# Patient Record
Sex: Female | Born: 1975 | Race: White | Hispanic: No | Marital: Single | Smoking: Current every day smoker
Health system: Southern US, Community
[De-identification: ages and names within clinical notes are randomized; demographics above are authoritative.]

---

## 2010-11-02 ENCOUNTER — Emergency Department: Payer: Self-pay | Admitting: Unknown Physician Specialty

## 2012-03-27 ENCOUNTER — Emergency Department: Payer: Self-pay | Admitting: Emergency Medicine

## 2012-03-27 LAB — URINALYSIS, COMPLETE
Leukocyte Esterase: NEGATIVE
Nitrite: POSITIVE
Ph: 6 (ref 4.5–8.0)
Protein: NEGATIVE
Specific Gravity: 1.01 (ref 1.003–1.030)
WBC UR: 15 /HPF (ref 0–5)

## 2012-05-28 ENCOUNTER — Emergency Department: Payer: Self-pay | Admitting: Emergency Medicine

## 2012-05-28 LAB — URINALYSIS, COMPLETE
Glucose,UR: NEGATIVE mg/dL (ref 0–75)
Ph: 5 (ref 4.5–8.0)
Protein: 100
RBC,UR: 28 /HPF (ref 0–5)
Specific Gravity: 1.031 (ref 1.003–1.030)
Squamous Epithelial: 3

## 2012-05-28 LAB — COMPREHENSIVE METABOLIC PANEL
Albumin: 4 g/dL (ref 3.4–5.0)
Alkaline Phosphatase: 56 U/L (ref 50–136)
Chloride: 110 mmol/L — ABNORMAL HIGH (ref 98–107)
Co2: 20 mmol/L — ABNORMAL LOW (ref 21–32)
Creatinine: 0.81 mg/dL (ref 0.60–1.30)
EGFR (Non-African Amer.): >60
Potassium: 3.6 mmol/L (ref 3.5–5.1)
SGPT (ALT): 15 U/L (ref 12–78)

## 2012-05-28 LAB — CBC
HGB: 11.9 g/dL — ABNORMAL LOW (ref 12.0–16.0)
RBC: 3.82 10*6/uL (ref 3.80–5.20)
RDW: 13.2 % (ref 11.5–14.5)

## 2012-05-30 LAB — URINE CULTURE

## 2013-05-22 ENCOUNTER — Emergency Department: Payer: Self-pay | Admitting: Emergency Medicine

## 2013-05-22 LAB — URINALYSIS, COMPLETE
Bacteria: NONE SEEN
Bilirubin,UR: NEGATIVE
Glucose,UR: NEGATIVE mg/dL (ref 0–75)
Leukocyte Esterase: NEGATIVE
Ph: 8 (ref 4.5–8.0)
Specific Gravity: 1.01 (ref 1.003–1.030)

## 2013-05-22 LAB — GC/CHLAMYDIA PROBE AMP

## 2013-05-22 LAB — WET PREP, GENITAL

## 2013-06-14 IMAGING — CT CT HEAD WITHOUT CONTRAST
2 series · 16 of 30 positions shown, 20 images · non-contrast
Comparison: none

REASON FOR EXAM: headache, syncope
COMMENTS:

PROCEDURE:     CT  - CT HEAD WITHOUT CONTRAST  - May 28, 2012  [DATE]
RESULT:     Comparison:  None
TECHNIQUE: Multiple axial images from the foramen magnum to the vertex were
obtained without IV contrast.

[Series 2: without · axial · non-contrast · 0.40mm/px · z∈[+358,+484]mm · 13 of 31 slices shown, 17 images]
[im 3/31  brain]
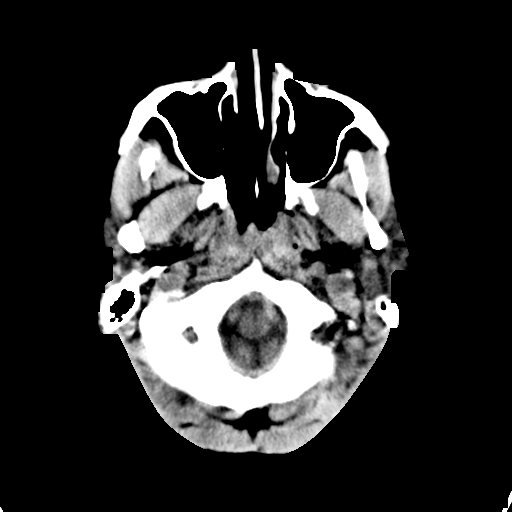
[im 3/31  bone]
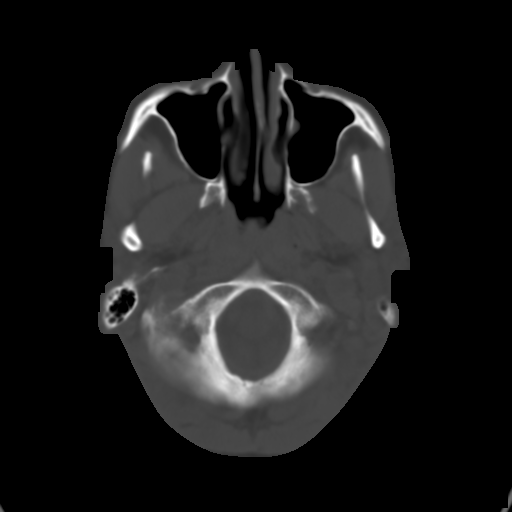
[im 5/31  brain]
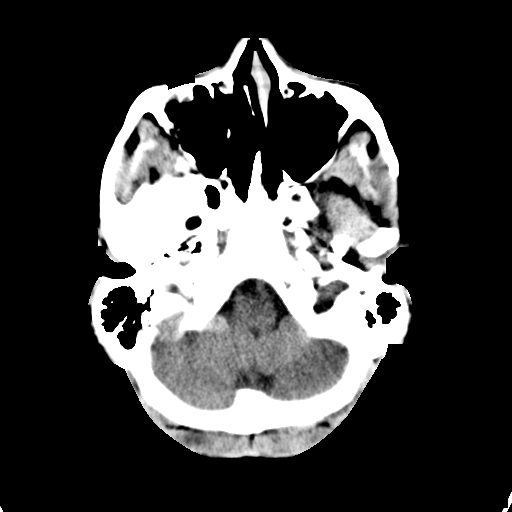
[im 7/31  brain]
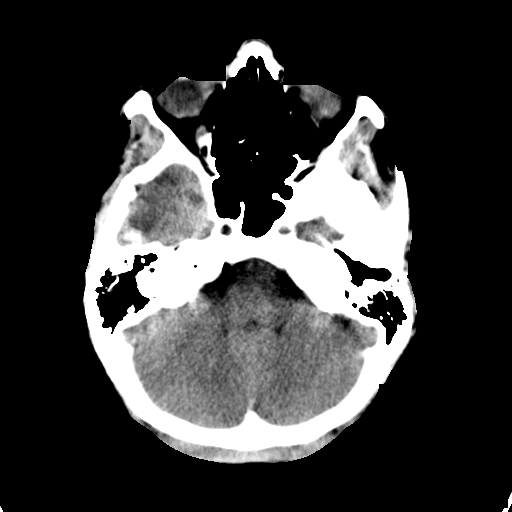
[im 9/31  brain]
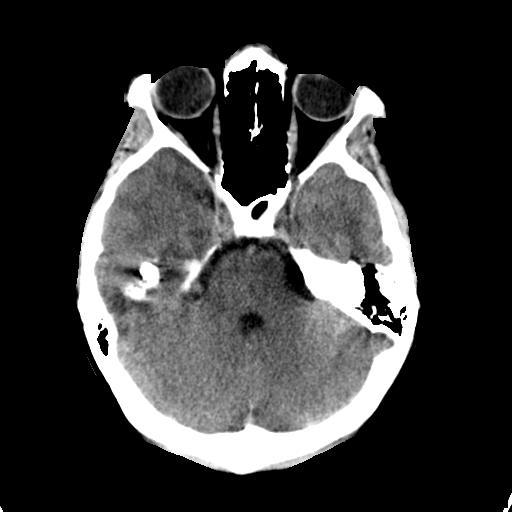
[im 11/31  brain]
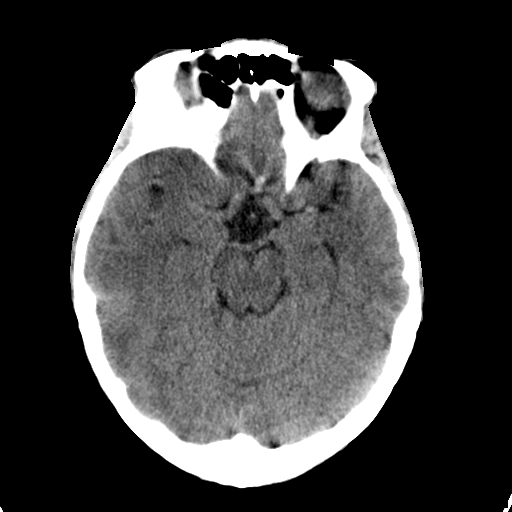
[im 11/31  bone]
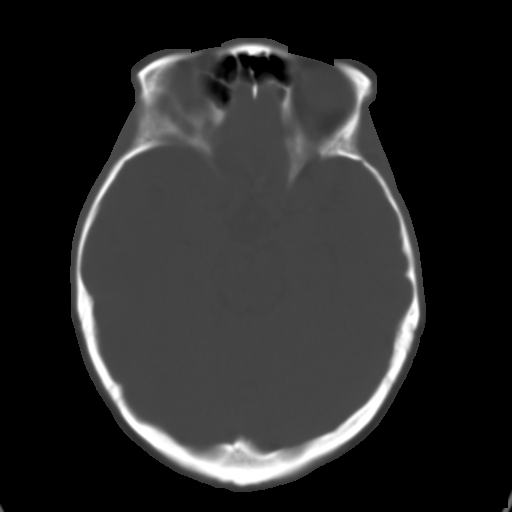
[im 13/31  brain]
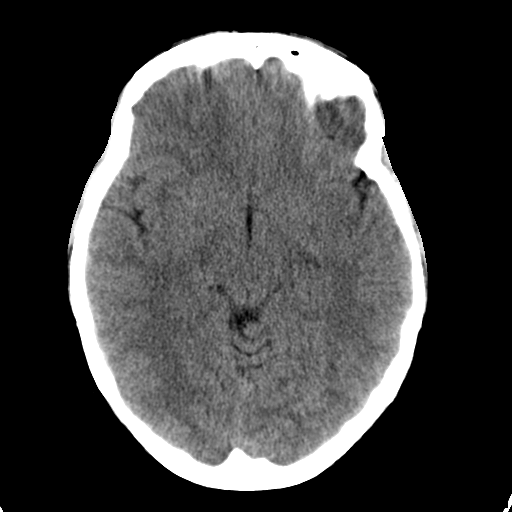
[im 16/31  brain]
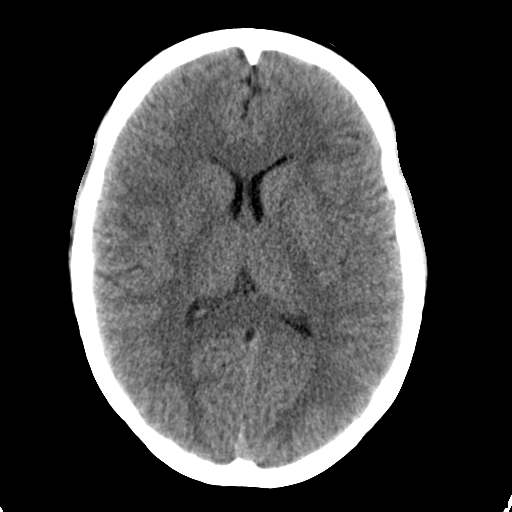
[im 18/31  brain]
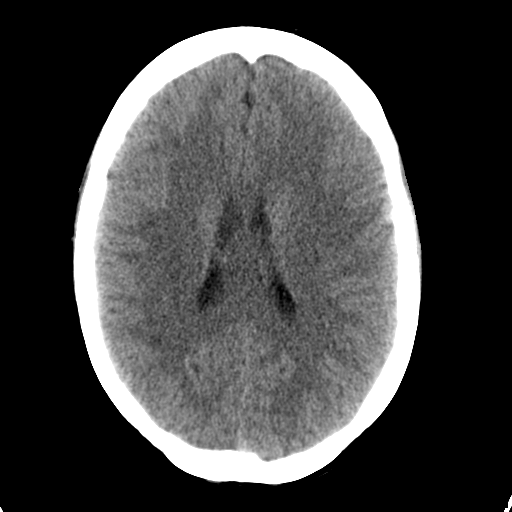
[im 20/31  brain]
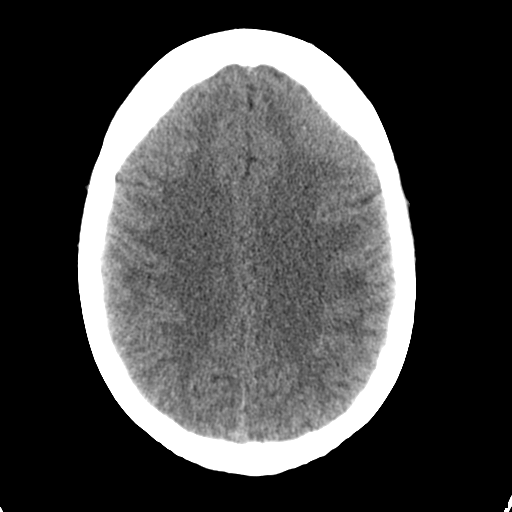
[im 20/31  bone]
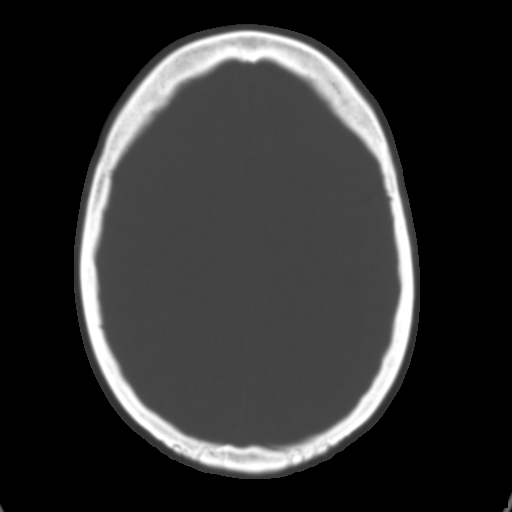
[im 22/31  brain]
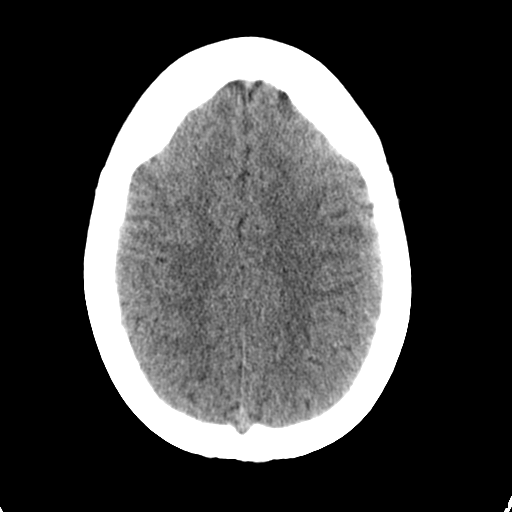
[im 24/31  brain]
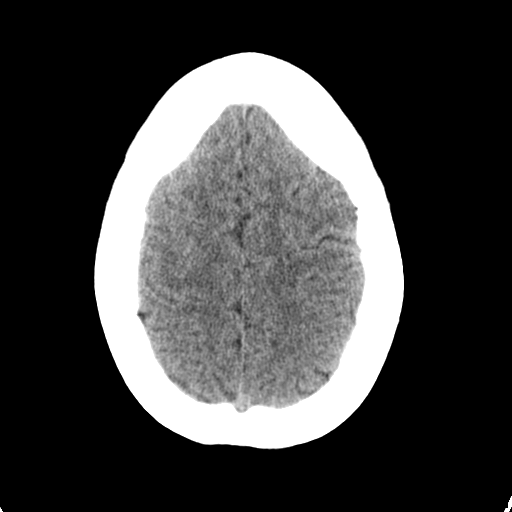
[im 26/31  brain]
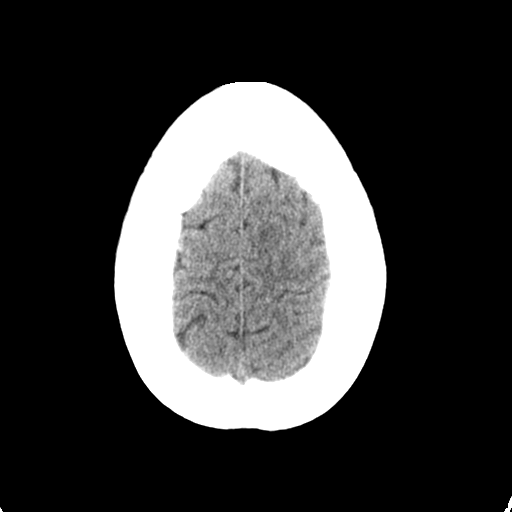
[im 28/31  brain]
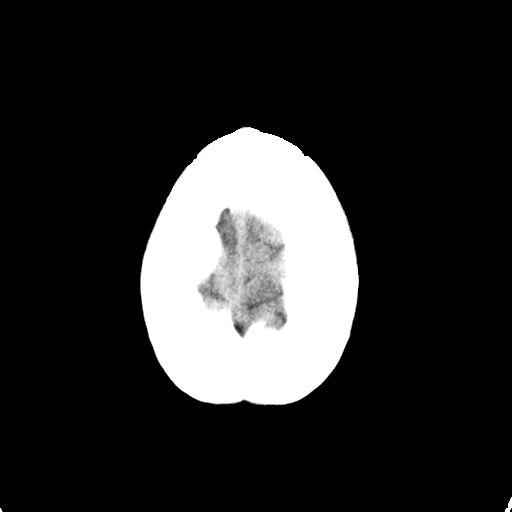
[im 28/31  bone]
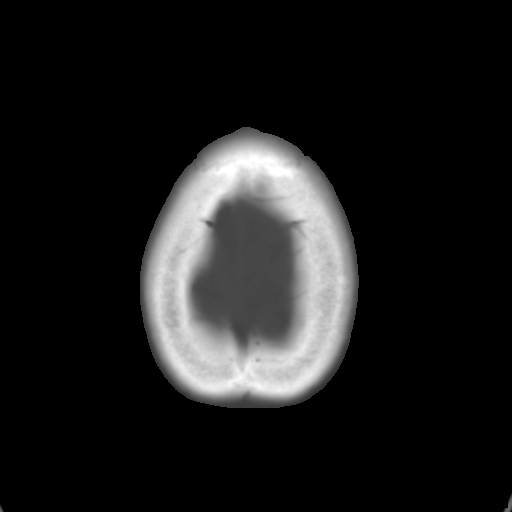

[Series 3: bone · axial · 0.40mm/px · z∈[+358,+398]mm · 3 of 31 slices shown]
[im 3/31  bone]
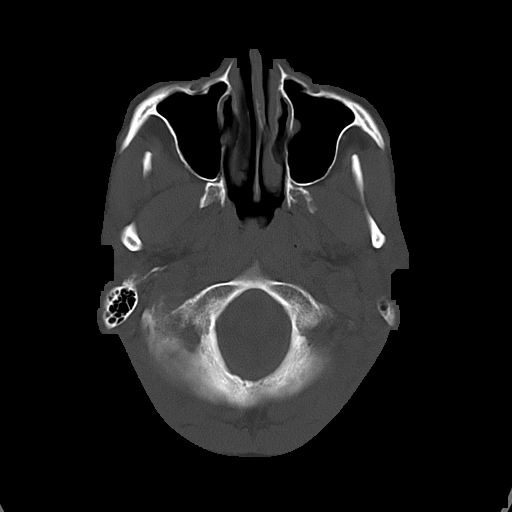
[im 7/31  bone]
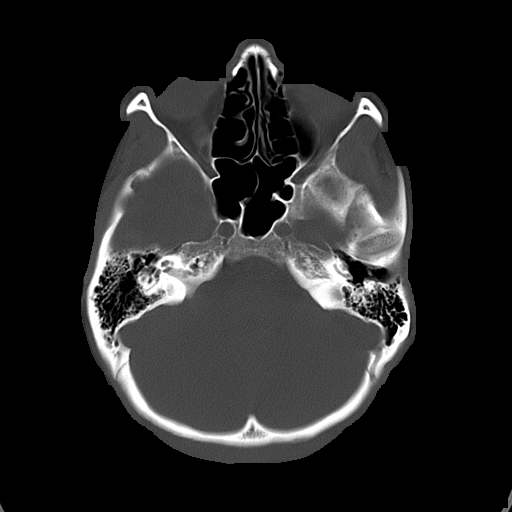
[im 11/31  bone]
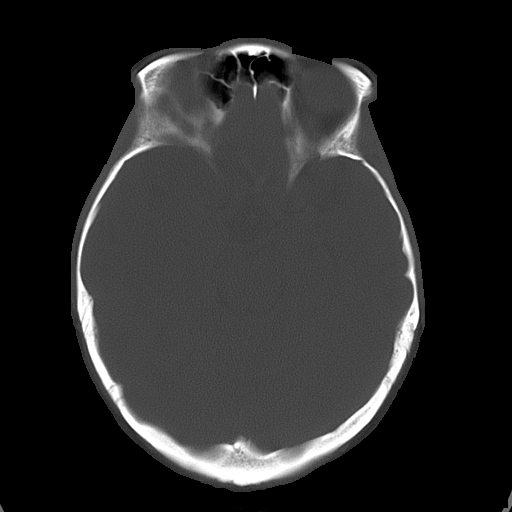

[16 of 30 positions shown; findings below may reference images not displayed]

FINDINGS: There is no evidence for mass effect, midline shift, or extra-axial fluid
collections. There is no evidence for space-occupying lesion, intracranial
hemorrhage, or cortical-based area of infarction. There are a few tiny
polypoid mucus retention cysts in the left maxillary sinus.

The osseous structures are unremarkable.
IMPRESSION: No acute intracranial process.

[REDACTED]

## 2017-07-10 ENCOUNTER — Encounter: Payer: Self-pay | Admitting: Emergency Medicine

## 2017-07-10 ENCOUNTER — Emergency Department
Admission: EM | Admit: 2017-07-10 | Discharge: 2017-07-10 | Disposition: A | Discharge status: Home / Self Care | Payer: Self-pay | Attending: Emergency Medicine | Admitting: Emergency Medicine

## 2017-07-10 ENCOUNTER — Other Ambulatory Visit: Payer: Self-pay

## 2017-07-10 DIAGNOSIS — Y999 Unspecified external cause status: Secondary | ICD-10-CM | POA: Insufficient documentation

## 2017-07-10 DIAGNOSIS — X58XXXA Exposure to other specified factors, initial encounter: Secondary | ICD-10-CM | POA: Insufficient documentation

## 2017-07-10 DIAGNOSIS — F17210 Nicotine dependence, cigarettes, uncomplicated: Secondary | ICD-10-CM | POA: Insufficient documentation

## 2017-07-10 DIAGNOSIS — S0502XA Injury of conjunctiva and corneal abrasion without foreign body, left eye, initial encounter: Secondary | ICD-10-CM | POA: Insufficient documentation

## 2017-07-10 DIAGNOSIS — Y939 Activity, unspecified: Secondary | ICD-10-CM | POA: Insufficient documentation

## 2017-07-10 DIAGNOSIS — Y929 Unspecified place or not applicable: Secondary | ICD-10-CM | POA: Insufficient documentation

## 2017-07-10 MED ORDER — NAPHAZOLINE-PHENIRAMINE 0.025-0.3 % OP SOLN: 1.0000 [drp] | Freq: Four times a day (QID) | OPHTHALMIC | 0 refills | Status: DC | PRN

## 2017-07-10 MED ORDER — TETRACAINE HCL 0.5 % OP SOLN
OPHTHALMIC | Status: AC
  Administered 2017-07-10: 1 [drp] via OPHTHALMIC
  Filled 2017-07-10: qty 4

## 2017-07-10 MED ORDER — EYE WASH OPHTH SOLN: 1.0000 [drp] | OPHTHALMIC | Status: DC | PRN

## 2017-07-10 MED ORDER — GENTAMICIN SULFATE 0.3 % OP SOLN: 1.0000 [drp] | OPHTHALMIC | 0 refills | Status: DC

## 2017-07-10 MED ORDER — TETRACAINE HCL 0.5 % OP SOLN
1.0000 [drp] | Freq: Once | OPHTHALMIC | Status: AC
  Administered 2017-07-10: 1 [drp] via OPHTHALMIC

## 2017-07-10 MED ORDER — FLUORESCEIN SODIUM 1 MG OP STRP
ORAL_STRIP | OPHTHALMIC | Status: AC
  Filled 2017-07-10: qty 1

## 2017-07-10 MED ORDER — EYE WASH OPHTH SOLN
OPHTHALMIC | Status: AC
  Filled 2017-07-10: qty 118

## 2017-07-10 NOTE — Discharge Instructions (Signed)
Use eyedrops as directed follow-up with eye clinic if no improvement in 3 days.

## 2017-07-10 NOTE — ED Provider Notes (Signed)
[facility] Emergency Department Provider Note   ____________________________________________   First MD Initiated Contact with Patient 07/10/17 1148     (approximate)  I have reviewed the triage vital signs and the nursing notes.   HISTORY  Chief Complaint Eye Problem    HPI Jacqueline Knight is a 42 y.o. female patient pain and watery left eye for approximately 2 months.  Patient states symptoms wax and wane but worsened in the past week.  Patient denies purulent drainage from the eye.  Patient states slight photophobic.  Patient has a history of contact usage but has not used to contact since the pain started.  No other pulses measured for complaint.  Patient denies vision loss.   History reviewed. No pertinent past medical history.  There are no active problems to display for this patient.     Prior to Admission medications   Medication Sig Start Date End Date Taking? Authorizing Provider  gentamicin (GARAMYCIN) 0.3 % ophthalmic solution Place 1 drop into the left eye every 4 (four) hours. 07/10/17   Joni ReiningSmith, Paxson Harrower K, PA-C  naphazoline-pheniramine (NAPHCON-A) 0.025-0.3 % ophthalmic solution Place 1 drop into the left eye 4 (four) times daily as needed for eye irritation. 07/10/17   Joni ReiningSmith, Petar Mucci K, PA-C    Allergies Patient has no known allergies.  No family history on file.  Social History Social History   Tobacco Use  . Smoking status: Current Every Day Smoker    Packs/day: 0.50    Types: Cigarettes  Substance Use Topics  . Alcohol use: No    Frequency: Never  . Drug use: No    Review of Systems Constitutional: No fever/chills Eyes: Mild photophobia and watery drainage from the eye. ENT: No sore throat. Cardiovascular: Denies chest pain. Respiratory: Denies shortness of breath. Gastrointestinal: No abdominal pain.  No nausea, no vomiting.  No diarrhea.  No constipation. Genitourinary: Negative for dysuria. Musculoskeletal: Negative  for back pain. Skin: Negative for rash. Neurological: Negative for headaches, focal weakness or numbness.   ____________________________________________   PHYSICAL EXAM:  VITAL SIGNS: ED Triage Vitals  Enc Vitals Group     BP 07/10/17 1142 135/86     Pulse Rate 07/10/17 1142 82     Resp 07/10/17 1142 18     Temp 07/10/17 1142 97.6 F (36.4 C)     Temp Source 07/10/17 1142 Oral     SpO2 07/10/17 1142 99 %     Weight 07/10/17 1143 175 lb (79.4 kg)     Height 07/10/17 1143 5' 4.5" (1.638 m)     Head Circumference --      Peak Flow --      Pain Score 07/10/17 1147 6     Pain Loc --      Pain Edu? --      Excl. in GC? --    Constitutional: Alert and oriented. Well appearing and in no acute distress. Eyes:  PERRL. EOMI. visual acuity patient at 2025 right eye and 2030 left affected eye with corrective lenses.  Fluorescein stain reveal left corneal abrasion. Cardiovascular: Normal rate, regular rhythm. Grossly normal heart sounds.  Good peripheral circulation. Respiratory: Normal respiratory effort.  No retractions. Lungs CTAB. Skin:  Skin is warm, dry and intact. No rash noted. Psychiatric: Mood and affect are normal. Speech and behavior are normal.  ____________________________________________   LABS (all labs ordered are listed, but only abnormal results are displayed)  Labs Reviewed - No data to display ____________________________________________  EKG   ____________________________________________  RADIOLOGY  No results found.  ____________________________________________   PROCEDURES  Procedure(s) performed: None  Procedures  Critical Care performed: No  ____________________________________________   INITIAL IMPRESSION / ASSESSMENT AND PLAN / ED COURSE  As part of my medical decision making, I reviewed the following data within the electronic MEDICAL RECORD NUMBER    Left eye pain secondary to corneal abrasion.  Patient given discharge care  instruction.  Patient given prescription for gentamicin and Naphcon-A.  Patient advised to follow-up with the [facility] if no improvement in 3 days secondary to the length of the time with corneal abrasion      ____________________________________________   FINAL CLINICAL IMPRESSION(S) / ED DIAGNOSES  Final diagnoses:  Abrasion of left cornea, initial encounter     ED Discharge Orders        Ordered    gentamicin (GARAMYCIN) 0.3 % ophthalmic solution  Every 4 hours     07/10/17 1203    naphazoline-pheniramine (NAPHCON-A) 0.025-0.3 % ophthalmic solution  4 times daily PRN     07/10/17 1203       Note:  This document was prepared using Dragon voice recognition software and may include unintentional dictation errors.    Joni Reining, PA-C 07/10/17 1209    Dionne Bucy, MD 07/10/17 1556

## 2017-07-10 NOTE — ED Triage Notes (Signed)
Pt reports that she has had a watery eye for 2 months but the last week has been bothering her more. Its watery but never crusted shut. States that the light makes it worse.

## 2017-09-26 ENCOUNTER — Encounter: Payer: Self-pay | Admitting: Emergency Medicine

## 2017-09-26 ENCOUNTER — Other Ambulatory Visit: Payer: Self-pay

## 2017-09-26 ENCOUNTER — Emergency Department
Admission: EM | Admit: 2017-09-26 | Discharge: 2017-09-26 | Disposition: A | Discharge status: Home / Self Care | Payer: Self-pay | Attending: Emergency Medicine | Admitting: Emergency Medicine

## 2017-09-26 DIAGNOSIS — F17210 Nicotine dependence, cigarettes, uncomplicated: Secondary | ICD-10-CM | POA: Insufficient documentation

## 2017-09-26 DIAGNOSIS — R220 Localized swelling, mass and lump, head: Secondary | ICD-10-CM | POA: Insufficient documentation

## 2017-09-26 DIAGNOSIS — K047 Periapical abscess without sinus: Secondary | ICD-10-CM | POA: Insufficient documentation

## 2017-09-26 MED ORDER — OXYCODONE-ACETAMINOPHEN 7.5-325 MG PO TABS: 1.0000 | ORAL_TABLET | Freq: Four times a day (QID) | ORAL | 0 refills | Status: DC | PRN

## 2017-09-26 MED ORDER — OXYCODONE-ACETAMINOPHEN 5-325 MG PO TABS
1.0000 | ORAL_TABLET | Freq: Once | ORAL | Status: AC
  Administered 2017-09-26: 1 via ORAL
  Filled 2017-09-26: qty 1

## 2017-09-26 MED ORDER — AMOXICILLIN 500 MG PO CAPS: 500.0000 mg | ORAL_CAPSULE | Freq: Three times a day (TID) | ORAL | 0 refills | Status: DC

## 2017-09-26 MED ORDER — LIDOCAINE VISCOUS 2 % MT SOLN: 5.0000 mL | Freq: Four times a day (QID) | OROMUCOSAL | 0 refills | Status: DC | PRN

## 2017-09-26 MED ORDER — LIDOCAINE VISCOUS 2 % MT SOLN
15.0000 mL | Freq: Once | OROMUCOSAL | Status: AC
  Administered 2017-09-26: 15 mL via OROMUCOSAL
  Filled 2017-09-26: qty 15

## 2017-09-26 MED ORDER — IBUPROFEN 600 MG PO TABS: 600.0000 mg | ORAL_TABLET | Freq: Three times a day (TID) | ORAL | 0 refills | Status: DC | PRN

## 2017-09-26 MED ORDER — IBUPROFEN 800 MG PO TABS
800.0000 mg | ORAL_TABLET | Freq: Once | ORAL | Status: AC
  Administered 2017-09-26: 800 mg via ORAL
  Filled 2017-09-26: qty 1

## 2017-09-26 NOTE — ED Provider Notes (Signed)
[facility] Emergency Department Provider Note   ____________________________________________   First MD Initiated Contact with Patient 09/26/17 1115     (approximate)  I have reviewed the triage vital signs and the nursing notes.   HISTORY  Chief Complaint Dental Pain    HPI Jacqueline Knight is a 42 y.o. female patient complain of left lower jaw pain and swelling secondary to dental issues.  Patient state increasing dental pain and swelling for 5 days.  Patient rates the pain as a 9/10.  Patient described the pain is "achy/shooting".  No relief over-the-counter anti-inflammatory medications.  History reviewed. No pertinent past medical history.  There are no active problems to display for this patient.   History reviewed. No pertinent surgical history.  Prior to Admission medications   Medication Sig Start Date End Date Taking? Authorizing Provider  amoxicillin (AMOXIL) 500 MG capsule Take 1 capsule (500 mg total) by mouth 3 (three) times daily. 09/26/17   Joni Reining, PA-C  gentamicin (GARAMYCIN) 0.3 % ophthalmic solution Place 1 drop into the left eye every 4 (four) hours. 07/10/17   Joni Reining, PA-C  ibuprofen (ADVIL,MOTRIN) 600 MG tablet Take 1 tablet (600 mg total) by mouth every 8 (eight) hours as needed. 09/26/17   Joni Reining, PA-C  lidocaine (XYLOCAINE) 2 % solution Use as directed 5 mLs in the mouth or throat every 6 (six) hours as needed for mouth pain. For oral swish as needed dental pain 09/26/17   Joni Reining, PA-C  naphazoline-pheniramine (NAPHCON-A) 0.025-0.3 % ophthalmic solution Place 1 drop into the left eye 4 (four) times daily as needed for eye irritation. 07/10/17   Joni Reining, PA-C  oxyCODONE-acetaminophen (PERCOCET) 7.5-325 MG tablet Take 1 tablet by mouth every 6 (six) hours as needed for severe pain. 09/26/17   Joni Reining, PA-C    Allergies Patient has no known allergies.  No family history on file.  Social  History Social History   Tobacco Use  . Smoking status: Current Every Day Smoker    Packs/day: 0.50    Types: Cigarettes  Substance Use Topics  . Alcohol use: No    Frequency: Never  . Drug use: No    Review of Systems Constitutional: No fever/chills Eyes: No visual changes. ENT: Dental pain. Cardiovascular: Denies chest pain. Respiratory: Denies shortness of breath. Gastrointestinal: No abdominal pain.  No nausea, no vomiting.  No diarrhea.  No constipation. Genitourinary: Negative for dysuria. Musculoskeletal: Negative for back pain. Skin: Negative for rash. Neurological: Negative for headaches, focal weakness or numbness.   ____________________________________________   PHYSICAL EXAM:  VITAL SIGNS: ED Triage Vitals  Enc Vitals Group     BP 09/26/17 1037 (!) 145/82     Pulse Rate 09/26/17 1037 90     Resp 09/26/17 1037 20     Temp 09/26/17 1037 98.2 F (36.8 C)     Temp Source 09/26/17 1037 Oral     SpO2 09/26/17 1037 100 %     Weight 09/26/17 1038 170 lb (77.1 kg)     Height 09/26/17 1038 5' 4.5" (1.638 m)     Head Circumference --      Peak Flow --      Pain Score 09/26/17 1038 9     Pain Loc --      Pain Edu? --      Excl. in GC? --    Constitutional: Alert and oriented. Well appearing and in no acute distress.  Mouth/Throat: Mucous membranes are moist.  Oropharynx non-erythematous.  Edematous gingiva and devitalized tooth #5 and 6. Neck: No stridor.  Hematological/Lymphatic/Immunilogical: No cervical lymphadenopathy. Cardiovascular: Normal rate, regular rhythm. Grossly normal heart sounds.  Good peripheral circulation. Respiratory: Normal respiratory effort.  No retractions. Lungs CTAB. Skin:  Skin is warm, dry and intact. No rash noted. Psychiatric: Mood and affect are normal. Speech and behavior are normal.  ____________________________________________   LABS (all labs ordered are listed, but only abnormal results are displayed)  Labs Reviewed -  No data to display ____________________________________________  EKG   ____________________________________________  RADIOLOGY  ED MD interpretation:    Official radiology report(s): No results found.  ____________________________________________   PROCEDURES  Procedure(s) performed: None  Procedures  Critical Care performed: No  ____________________________________________   INITIAL IMPRESSION / ASSESSMENT AND PLAN / ED COURSE  As part of my medical decision making, I reviewed the following data within the electronic MEDICAL RECORD NUMBER    Dental pain secondary to abscess.  Patient given discharge care instruction.  Patient given list of dental clinics for follow-up care.  Patient advised take medication as directed.      ____________________________________________   FINAL CLINICAL IMPRESSION(S) / ED DIAGNOSES  Final diagnoses:  Dental abscess     ED Discharge Orders        Ordered    amoxicillin (AMOXIL) 500 MG capsule  3 times daily     09/26/17 1132    oxyCODONE-acetaminophen (PERCOCET) 7.5-325 MG tablet  Every 6 hours PRN     09/26/17 1132    ibuprofen (ADVIL,MOTRIN) 600 MG tablet  Every 8 hours PRN     09/26/17 1132    lidocaine (XYLOCAINE) 2 % solution  Every 6 hours PRN     09/26/17 1132       Note:  This document was prepared using Dragon voice recognition software and may include unintentional dictation errors.    Joni ReiningSmith, Ronald K, PA-C 09/26/17 1138    Don PerkingVeronese, [place], MD 09/26/17 [phone]

## 2017-09-26 NOTE — Discharge Instructions (Signed)
Advised to follow-up from list of dental clinics provided. OPTIONS FOR DENTAL FOLLOW UP CARE  [place] Department of Health and Human Services - Local Safety Net Dental Clinics [email or web address].gov/dph/oralhealth/services/safetynetclinics.htm   [facility] [phone])  [facility] [phone])  [facility] [phone]  [facility] [phone])  [facility] [phone]) This clinic caters to the indigent population and is on a lottery system. Location: [employer] [facility] [address] [facility], [address], [place] [facility] Hours: Wednesdays from 6pm - 9pm, patients seen by a lottery system. For dates, call or go to [email or web address] Services: Cleanings, fillings and simple extractions. Payment Options: DENTAL WORK IS FREE OF CHARGE. Bring proof of income or support. Best way to get seen: Arrive at 5:15 pm - this is a lottery, NOT first come/first serve, so arriving earlier will not increase your chances of being seen.     [facility] [phone] Select option 1 for emergencies   Location: [facility], [address], [place] [facility] Hours: No walk-ins accepted - call the day before to schedule an appointment. Check in times are 9:30 am and 1:30 pm. Services: Simple extractions, temporary fillings, pulpectomy/pulp debridement, uncomplicated abscess drainage. Payment Options: PAYMENT IS DUE AT THE TIME OF SERVICE.  Fee is usually $100-200, additional surgical procedures (e.g. abscess drainage) may be extra. Cash, checks, Visa/MasterCard accepted.  Can file Medicaid if patient is covered for dental - patient should call case worker to check. No discount for [facility] patients. Best way to get seen: MUST call the day before and get onto the schedule. Can usually be seen the next 1-2 days. No walk-ins accepted.      [facility] [phone]   Location: [facility], [address], [place] Clinic Hours: M, W, Th, F 8am or 1:30pm, Tues 9a or 1:30 - first come/first served. Services: Simple extractions, temporary fillings, uncomplicated abscess drainage.  You do not need to be [place] resident. Payment Options: PAYMENT IS DUE AT THE TIME OF SERVICE. Dental insurance, otherwise sliding scale - bring proof of income or support. Depending on income and treatment needed, cost is usually $50-200. Best way to get seen: Arrive early as it is first come/first served.     [facility] [phone]   Location: [address] Clinic Hours: Mon-Thu 8a-5p Services: Most basic dental services including extractions and fillings. Payment Options: PAYMENT IS DUE AT THE TIME OF SERVICE. Sliding scale, up to 50% off - bring proof if income or support. Medicaid with dental option accepted. Best way to get seen: Call to schedule an appointment, can usually be seen within 2 weeks OR they will try to see walk-ins - show up at 8a or 2p (you may have to wait).     [facility] [phone] [place] RESIDENTS ONLY   Location: [facility], [address], [place], [place] [address] Clinic Hours: By appointment only. Monday - Thursday 8am-5pm, Friday 8am-12pm Services: Cleanings, fillings, extractions. Payment Options: PAYMENT IS DUE AT THE TIME OF SERVICE. Cash, Visa or MasterCard. Sliding scale - $30 minimum per service. Best way to get seen: Come in to office, complete packet and make an appointment - need proof of income or support monies for each household member and proof of [place] residence. Usually takes about a month to get in.     [facility] [phone]   Location: [address], [place] [facility] Hours: Walk-in Urgent Care  Dental Services are offered Monday-Friday mornings only. The numbers of emergencies accepted daily is limited to the number of providers available. Maximum 15 - Mondays, Wednesdays & Thursdays Maximum 10 - Tuesdays & Fridays Services: You do not need to be a [facility] resident to be seen for a dental emergency. Emergencies are defined as pain, swelling, abnormal bleeding, or dental trauma. Walkins will receive x-rays if needed. NOTE: Dental cleaning is not an emergency. Payment Options: PAYMENT IS DUE AT THE TIME OF SERVICE. Minimum co-pay is $40.00 for uninsured patients. Minimum co-pay is $3.00 for Medicaid with dental coverage. Dental Insurance is accepted and must be presented at time of visit. Medicare does not cover dental. Forms of payment: Cash, credit card, checks. Best way to get seen: If not previously registered with the clinic, walk-in dental registration begins at 7:15 am and is on a first come/first serve basis. If previously registered with the clinic, call to make an appointment.     [facility] [place] ONLY   Location: [address], [place], [place] Clinic Hours: Mon-Thu 10a-2p Services: Extractions only! Payment Options: FREE (donations accepted) - bring proof of income or support Best way to get seen: Call and schedule an appointment OR come at 8am on the 1st Monday of every month (except for holidays) when it is first come/first served.     [facility] [phone]   Location: [place], [place] Clinic Hours: Friday mornings Services, Payment Options, Best way to get seen: Call for info

## 2017-09-26 NOTE — ED Triage Notes (Signed)
L lower dental pain x 5 days noted swelling yesterday.

## 2017-11-25 ENCOUNTER — Encounter: Payer: Self-pay | Admitting: Emergency Medicine

## 2017-11-25 ENCOUNTER — Other Ambulatory Visit: Payer: Self-pay

## 2017-11-25 ENCOUNTER — Emergency Department
Admission: EM | Admit: 2017-11-25 | Discharge: 2017-11-25 | Disposition: A | Discharge status: Home / Self Care | Payer: Self-pay | Attending: Emergency Medicine | Admitting: Emergency Medicine

## 2017-11-25 DIAGNOSIS — K029 Dental caries, unspecified: Secondary | ICD-10-CM | POA: Insufficient documentation

## 2017-11-25 DIAGNOSIS — K047 Periapical abscess without sinus: Secondary | ICD-10-CM | POA: Insufficient documentation

## 2017-11-25 DIAGNOSIS — F17210 Nicotine dependence, cigarettes, uncomplicated: Secondary | ICD-10-CM | POA: Insufficient documentation

## 2017-11-25 MED ORDER — AMOXICILLIN 500 MG PO CAPS
500.0000 mg | ORAL_CAPSULE | Freq: Three times a day (TID) | ORAL | 0 refills | Status: DC
Start: 2017-11-25 — End: 2017-12-22

## 2017-11-25 MED ORDER — TRAMADOL HCL 50 MG PO TABS
50.0000 mg | ORAL_TABLET | Freq: Once | ORAL | Status: AC
  Administered 2017-11-25: 50 mg via ORAL
  Filled 2017-11-25: qty 1

## 2017-11-25 MED ORDER — ONDANSETRON 4 MG PO TBDP
4.0000 mg | ORAL_TABLET | Freq: Once | ORAL | Status: AC
  Administered 2017-11-25: 4 mg via ORAL
  Filled 2017-11-25: qty 1

## 2017-11-25 MED ORDER — AMOXICILLIN 500 MG PO CAPS
500.0000 mg | ORAL_CAPSULE | Freq: Once | ORAL | Status: AC
  Administered 2017-11-25: 500 mg via ORAL
  Filled 2017-11-25: qty 1

## 2017-11-25 MED ORDER — TRAMADOL HCL 50 MG PO TABS: 50.0000 mg | ORAL_TABLET | Freq: Two times a day (BID) | ORAL | 0 refills | Status: AC

## 2017-11-25 MED ORDER — ONDANSETRON 4 MG PO TBDP: 4.0000 mg | ORAL_TABLET | Freq: Three times a day (TID) | ORAL | 0 refills | Status: AC | PRN

## 2017-11-25 NOTE — ED Triage Notes (Signed)
Pt comes into the ED via POv c/o Left lower dental pain.  Selling noted but patient in NAD at this time.

## 2017-11-25 NOTE — Discharge Instructions (Addendum)
Take the antibiotic as directed. Rinse with warm salty water after every meal. Follow-up with one of the dental clinics listed below for dental extractions.   OPTIONS FOR DENTAL FOLLOW UP CARE   Department of Health and Human Services - Local Safety Net Dental Clinics [email or web address]://www.ncdhhs.gov/dph/oralhealth/services/safetynetclinics.htm   [facility] [phone])  [facility] [phone])  [facility] [phone])  [facility] [phone])  [facility] [phone]) This clinic caters to the indigent population and is on a lottery system. Location: [employer] [address], [address], [place] Clinic Hours: Wednesdays from 6pm - 9pm, patients seen by a lottery system. For dates, call or go to [email or web address] Services: Cleanings, fillings and simple extractions. Payment Options: DENTAL WORK IS FREE OF CHARGE. Bring proof of income or support. Best way to get seen: Arrive at 5:15 pm - this is a lottery, NOT first come/first serve, so arriving earlier will not increase your chances of being seen.     [facility] [phone] Select option 1 for emergencies   Location: [facility] [address], [address], [place] Clinic Hours: No walk-ins accepted - call the day before to schedule an appointment. Check in times are 9:30 am and 1:30 pm. Services: Simple extractions, temporary fillings, pulpectomy/pulp debridement, uncomplicated abscess drainage. Payment Options: PAYMENT IS DUE AT THE TIME OF SERVICE.  Fee is usually $100-200, additional surgical procedures (e.g. abscess drainage) may be extra. Cash, checks, Visa/MasterCard accepted.  Can file Medicaid if patient is covered for dental - patient should call case worker to check. No discount for [facility] patients. Best way to get seen: MUST call the  day before and get onto the schedule. Can usually be seen the next 1-2 days. No walk-ins accepted.     [facility] [phone]   Location: [facility], [address], [place] Clinic Hours: M, W, Th, F 8am or 1:30pm, Tues 9a or 1:30 - first come/first served. Services: Simple extractions, temporary fillings, uncomplicated abscess drainage.  You do not need to be an [place] resident. Payment Options: PAYMENT IS DUE AT THE TIME OF SERVICE. Dental insurance, otherwise sliding scale - bring proof of income or support. Depending on income and treatment needed, cost is usually $50-200. Best way to get seen: Arrive early as it is first come/first served.     [facility] [phone]   Location: [address] Clinic Hours: Mon-Thu 8a-5p Services: Most basic dental services including extractions and fillings. Payment Options: PAYMENT IS DUE AT THE TIME OF SERVICE. Sliding scale, up to 50% off - bring proof if income or support. Medicaid with dental option accepted. Best way to get seen: Call to schedule an appointment, can usually be seen within 2 weeks OR they will try to see walk-ins - show up at 8a or 2p (you may have to wait).     [facility] [phone] [place] RESIDENTS ONLY   Location: [facility], [address], [place], [place] [id number] Clinic Hours: By appointment only. Monday - Thursday 8am-5pm, Friday 8am-12pm Services: Cleanings, fillings, extractions. Payment Options: PAYMENT IS DUE AT THE TIME OF SERVICE. Cash, Visa or MasterCard. Sliding scale - $30 minimum per service. Best way to get seen: Come in to office, complete packet and make an appointment - need proof of income or support monies for each household member and proof of  [facility] residence. Usually takes about a month to get in.     [facility] [phone]   Location: [address], [place] Clinic Hours: Walk-in Urgent Care Dental Services are offered Monday-Friday mornings only. The numbers of emergencies accepted daily is limited to the number of providers available. Maximum 15 - Mondays, Wednesdays & Thursdays Maximum 10 - Tuesdays & Fridays Services: You do not need to be a [facility] resident to be seen for a dental emergency. Emergencies are defined as pain, swelling, abnormal bleeding, or dental trauma. Walkins will receive x-rays if needed. NOTE: Dental cleaning is not an emergency. Payment Options: PAYMENT IS DUE AT THE TIME OF SERVICE. Minimum co-pay is $40.00 for uninsured patients. Minimum co-pay is $3.00 for Medicaid with dental coverage. Dental Insurance is accepted and must be presented at time of visit. Medicare does not cover dental. Forms of payment: Cash, credit card, checks. Best way to get seen: If not previously registered with the clinic, walk-in dental registration begins at 7:15 am and is on a first come/first serve basis. If previously registered with the clinic, call to make an appointment.     [facility] [place] ONLY   Location: [address] [place], [place] Clinic Hours: Mon-Thu 10a-2p Services: Extractions only! Payment Options: FREE (donations accepted) - bring proof of income or support Best way to get seen: Call and schedule an appointment OR come at 8am on the 1st Monday of every month (except for holidays) when it is first come/first served.     [facility] [phone]   Location: [place], [place] Clinic Hours: Friday mornings Services, Payment Options, Best way to get seen: Call for info

## 2017-11-25 NOTE — ED Provider Notes (Signed)
[facility] Emergency Department Provider Note ____________________________________________  Time seen: 1502  I have reviewed the triage vital signs and the nursing notes.  HISTORY  Chief Complaint  Dental Pain  HPI Jacqueline Knight is a 42 y.o. female presents to the ED for evaluation of dental pain and swelling to the left lower jaw.  Patient with a history of poor dentition presents for evaluation of suspected dental abscess.  She was evaluated here 2 months ago for the same complaint of the same region.  She denies any fevers, chills, or sweats.  She also denies any spontaneous purulent drainage.  She is able to control oral secretions and been able to eat and drink without difficulty. She has not followed up with a dental provider, despite being referred last time.   History reviewed. No pertinent past medical history.  There are no active problems to display for this patient.  History reviewed. No pertinent surgical history.  Prior to Admission medications   Medication Sig Start Date End Date Taking? Authorizing Provider  amoxicillin (AMOXIL) 500 MG capsule Take 1 capsule (500 mg total) by mouth 3 (three) times daily. 11/25/17   Giannah Zavadil, Charlesetta Ivory, PA-C  ondansetron (ZOFRAN ODT) 4 MG disintegrating tablet Take 1 tablet (4 mg total) by mouth every 8 (eight) hours as needed. 11/25/17   Luken Shadowens, Charlesetta Ivory, PA-C  traMADol (ULTRAM) 50 MG tablet Take 1 tablet (50 mg total) by mouth 2 (two) times daily. 11/25/17   Jatinder Mcdonagh, Charlesetta Ivory, PA-C    Allergies Patient has no known allergies.  No family history on file.  Social History Social History   Tobacco Use  . Smoking status: Current Every Day Smoker    Packs/day: 0.50    Types: Cigarettes  . Smokeless tobacco: Never Used  Substance Use Topics  . Alcohol use: No    Frequency: Never  . Drug use: No    Review of Systems  Constitutional: Negative for fever. Eyes: Negative for visual changes. ENT:  Negative for sore throat.  Dental pain and left lower jaw swelling as above. Cardiovascular: Negative for chest pain. Respiratory: Negative for shortness of breath. Gastrointestinal: Negative for abdominal pain, vomiting and diarrhea. Skin: Negative for rash. Neurological: Negative for headaches, focal weakness or numbness. ____________________________________________  PHYSICAL EXAM:  VITAL SIGNS: ED Triage Vitals [11/25/17 1439]  Enc Vitals Group     BP      Pulse      Resp      Temp      Temp src      SpO2      Weight      Height      Head Circumference      Peak Flow      Pain Score 7     Pain Loc      Pain Edu?      Excl. in GC?     Constitutional: Alert and oriented. Well appearing and in no distress. Head: Normocephalic and atraumatic. Eyes: Conjunctivae are normal. Normal extraocular movements Mouth/Throat: Mucous membranes are moist.  We will is midline and tonsils are flat.  No oropharyngeal lesions are appreciated.  Patient with poor dentition noted throughout.  To the left lower jaw the patient has a chronically broken first molar down to the gumline.  There is focal edema to the left lower jaw of the buccal mucosa.  No pointing abscess, fluctuance, or spontaneous drainage is noted at the gumline.  No brawny sublingual  edema is appreciated. Neck: Supple. No thyromegaly. Hematological/Lymphatic/Immunological: No cervical lymphadenopathy. Cardiovascular: Normal rate, regular rhythm. Normal distal pulses. Respiratory: Normal respiratory effort. No wheezes/rales/rhonchi. Skin:  Skin is warm, dry and intact. No rash noted. ____________________________________________  PROCEDURES  Procedures Amoxicillin 500 mg PO Zofran 4 mg ODT Ultram 50 mg PO ____________________________________________  INITIAL IMPRESSION / ASSESSMENT AND PLAN / ED COURSE  Patient with ED evaluation and management of sudden lower jaw swelling secondary to dental caries.  Patient will be  treated for an acute dental abscess.  Her vital signs are stable in the ED.  She will be discharged with prescription for amoxicillin, Zofran, and Ultram #10.  She will again be referred to our usual and customary dental providers.  She is encouraged to follow-up for definitive management and dental extraction.  Return precautions have been reviewed with the patient.  I reviewed the patient's prescription history over the last 12 months in the multi-state controlled substances database(s) that includes [place], [place], [place], [place], [place], [place], [place], [place], [place], [place], [place], and [place].  Results were notable for no current narcotic prescriptions.  ____________________________________________  FINAL CLINICAL IMPRESSION(S) / ED DIAGNOSES  Final diagnoses:  Dental caries  Dental infection      Lissa HoardMenshew, Chayanne Filippi V Bacon, PA-C 11/25/17 1559    Phineas SemenGoodman, Graydon, MD 11/25/17 1600

## 2017-11-25 NOTE — ED Notes (Signed)
Left lower dental pain since yesterday. Now jaw is swollen.  No fever.  Does not have dentist.

## 2017-12-22 ENCOUNTER — Encounter: Payer: Self-pay | Admitting: Emergency Medicine

## 2017-12-22 ENCOUNTER — Emergency Department
Admission: EM | Admit: 2017-12-22 | Discharge: 2017-12-22 | Disposition: A | Discharge status: Home / Self Care | Payer: Self-pay | Attending: Emergency Medicine | Admitting: Emergency Medicine

## 2017-12-22 DIAGNOSIS — R220 Localized swelling, mass and lump, head: Secondary | ICD-10-CM | POA: Insufficient documentation

## 2017-12-22 DIAGNOSIS — F17210 Nicotine dependence, cigarettes, uncomplicated: Secondary | ICD-10-CM | POA: Insufficient documentation

## 2017-12-22 DIAGNOSIS — K0889 Other specified disorders of teeth and supporting structures: Secondary | ICD-10-CM | POA: Insufficient documentation

## 2017-12-22 MED ORDER — AMOXICILLIN 500 MG PO CAPS
500.0000 mg | ORAL_CAPSULE | Freq: Once | ORAL | Status: AC
  Administered 2017-12-22: 500 mg via ORAL
  Filled 2017-12-22: qty 1

## 2017-12-22 MED ORDER — KETOROLAC TROMETHAMINE 10 MG PO TABS: 10.0000 mg | ORAL_TABLET | Freq: Four times a day (QID) | ORAL | 0 refills | Status: AC | PRN

## 2017-12-22 MED ORDER — AMOXICILLIN 500 MG PO TABS: 500.0000 mg | ORAL_TABLET | Freq: Three times a day (TID) | ORAL | 0 refills | Status: AC

## 2017-12-22 MED ORDER — KETOROLAC TROMETHAMINE 30 MG/ML IJ SOLN
30.0000 mg | Freq: Once | INTRAMUSCULAR | Status: AC
  Administered 2017-12-22: 30 mg via INTRAMUSCULAR
  Filled 2017-12-22: qty 1

## 2017-12-22 NOTE — Discharge Instructions (Signed)
OPTIONS FOR DENTAL FOLLOW UP CARE ° °[place] Department of Health and Human Services - Local Safety Net Dental Clinics °http://www.ncdhhs.gov/dph/oralhealth/services/safetynetclinics.htm °  °[facility] ([phone]) ° °[facility] ([phone]) ° °[facility] ([phone]) ° °[facility] ([phone]) ° °[facility] ([phone]) °This clinic caters to the indigent population and is on a lottery system. °Location: °[facility] [address] [facility], [address], [place] °Clinic Hours: °Wednesdays from 6pm - 9pm, patients seen by a lottery system. °For dates, call or go to [email or web address] °Services: °Cleanings, fillings and simple extractions. °Payment Options: °DENTAL WORK IS FREE OF CHARGE. Bring proof of income or support. °Best way to get seen: °Arrive at 5:15 pm - this is a lottery, NOT first come/first serve, so arriving earlier will not increase your chances of being seen. °  °  °[facility] °[phone] °Select option 1 for emergencies °  °Location: °[facility], [address], [place] °Clinic Hours: °No walk-ins accepted - call the day before to schedule an appointment. °Check in times are 9:30 am and 1:30 pm. °Services: °Simple extractions, temporary fillings, pulpectomy/pulp debridement, uncomplicated abscess drainage. °Payment Options: °PAYMENT IS DUE AT THE TIME OF SERVICE.  Fee is usually $100-200, additional surgical procedures (e.g. abscess drainage) may be extra. °Cash, checks, Visa/MasterCard accepted.  Can file Medicaid if patient is covered for dental - patient should call case worker to check. °No discount for [facility] patients. °Best way to get seen: °MUST call the day before and get onto the schedule. Can usually be seen the next 1-2 days. No walk-ins accepted. °  °  °[facility] °[phone] °   °Location: °[facility], [address], [place] °Clinic Hours: °M, W, Th, F 8am or 1:30pm, Tues 9a or 1:30 - first come/first served. °Services: °Simple extractions, temporary fillings, uncomplicated abscess drainage.  You do not need to be an [place] resident. °Payment Options: °PAYMENT IS DUE AT THE TIME OF SERVICE. °Dental insurance, otherwise sliding scale - bring proof of income or support. °Depending on income and treatment needed, cost is usually $50-200. °Best way to get seen: °Arrive early as it is first come/first served. °  °  °[facility] °[phone] °  °Location: °[address] °Clinic Hours: °Mon-Thu 8a-5p °Services: °Most basic dental services including extractions and fillings. °Payment Options: °PAYMENT IS DUE AT THE TIME OF SERVICE. °Sliding scale, up to 50% off - bring proof if income or support. °Medicaid with dental option accepted. °Best way to get seen: °Call to schedule an appointment, can usually be seen within 2 weeks OR they will try to see walk-ins - show up at 8a or 2p (you may have to wait). °  °  °[facility] °[phone] °[place] RESIDENTS ONLY °  °Location: °[facility], [address], [place], [place] [address] °Clinic Hours: By appointment only. °Monday - Thursday 8am-5pm, Friday 8am-12pm °Services: Cleanings, fillings, extractions. °Payment Options: °PAYMENT IS DUE AT THE TIME OF SERVICE. °Cash, Visa or MasterCard. Sliding scale - $30 minimum per service. °Best way to get seen: °Come in to office, complete packet and make an appointment - need proof of income °or support monies for each household member and proof of [place] residence. °Usually takes about a month to get in. °  °  °[facility] °[phone] °  °Location: °[address]   [place] °Clinic Hours: Walk-in Urgent Care Dental Services are offered Monday-Friday  mornings only. °The numbers of emergencies accepted daily is limited to the number of °providers available. °Maximum 15 - Mondays, Wednesdays & Thursdays °Maximum 10 - Tuesdays & Fridays °Services: °You do not need to be a [place] resident to be seen for a dental emergency. °Emergencies are defined as pain, swelling, abnormal bleeding, or dental trauma. Walkins will receive x-rays if needed. °NOTE: Dental cleaning is not an emergency. °Payment Options: °PAYMENT IS DUE AT THE TIME OF SERVICE. °Minimum co-pay is $40.00 for uninsured patients. °Minimum co-pay is $3.00 for Medicaid with dental coverage. °Dental Insurance is accepted and must be presented at time of visit. °Medicare does not cover dental. °Forms of payment: Cash, credit card, checks. °Best way to get seen: °If not previously registered with the clinic, walk-in dental registration begins at 7:15 am and is on a first come/first serve basis. °If previously registered with the clinic, call to make an appointment. °  °  °[facility] °[phone] °[place] RESIDENTS ONLY °  °Location: °[address], [place], [place] °Clinic Hours: °Mon-Thu 10a-2p °Services: Extractions only! °Payment Options: °FREE (donations accepted) - bring proof of income or support °Best way to get seen: °Call and schedule an appointment OR come at 8am on the 1st Monday of every month (except for holidays) when it is first come/first served. °  °  °[facility] °[phone] °  °Location: °[address], [place] °Clinic Hours: °Friday mornings °Services, Payment Options, Best way to get seen: °Call for info °

## 2017-12-22 NOTE — ED Provider Notes (Signed)
[facility] Emergency Department Provider Note  ____________________________________________  Time seen: Approximately 10:49 PM  I have reviewed the triage vital signs and the nursing notes.   HISTORY  Chief Complaint Dental Pain    HPI Jacqueline Knight is a 42 y.o. female presents to the emergency department with 10 out of 10 inferior 32 pain that started today.  Patient reports that she has not made an appointment with a local dentist due to a lack of insurance coverage.  She has noticed some right upper jaw swelling.  No discharge from the gumline or perceived fever.  No alleviating measures of been attempted.  Patient was observed sleeping in the emergency department comfortably.   History reviewed. No pertinent past medical history.  There are no active problems to display for this patient.   History reviewed. No pertinent surgical history.  Prior to Admission medications   Medication Sig Start Date End Date Taking? Authorizing Provider  amoxicillin (AMOXIL) 500 MG tablet Take 1 tablet (500 mg total) by mouth 3 (three) times daily for 10 days. 12/22/17 01/01/18  Orvil Feil, PA-C  ketorolac (TORADOL) 10 MG tablet Take 1 tablet (10 mg total) by mouth every 6 (six) hours as needed for up to 5 days. 12/22/17 12/27/17  Orvil Feil, PA-C  ondansetron (ZOFRAN ODT) 4 MG disintegrating tablet Take 1 tablet (4 mg total) by mouth every 8 (eight) hours as needed. 11/25/17   Menshew, Charlesetta Ivory, PA-C  traMADol (ULTRAM) 50 MG tablet Take 1 tablet (50 mg total) by mouth 2 (two) times daily. 11/25/17   Menshew, Charlesetta Ivory, PA-C    Allergies Patient has no known allergies.  History reviewed. No pertinent family history.  Social History Social History   Tobacco Use  . Smoking status: Current Every Day Smoker    Packs/day: 0.50    Types: Cigarettes  . Smokeless tobacco: Never Used  Substance Use Topics  . Alcohol use: No    Frequency: Never  . Drug use: No      Review of Systems  Constitutional: No fever/chills Eyes: No visual changes. No discharge ENT: Patient has dental pain Cardiovascular: no chest pain. Respiratory: no cough. No SOB. Gastrointestinal: No abdominal pain.  No nausea, no vomiting.  No diarrhea.  No constipation. Musculoskeletal: Negative for musculoskeletal pain. Skin: Negative for rash, abrasions, lacerations, ecchymosis. Neurological: Negative for headaches, focal weakness or numbness.   ____________________________________________   PHYSICAL EXAM:  VITAL SIGNS: ED Triage Vitals [12/22/17 2040]  Enc Vitals Group     BP 128/84     Pulse Rate 79     Resp 17     Temp (!) 97.5 F (36.4 C)     Temp Source Oral     SpO2 98 %     Weight 163 lb (73.9 kg)     Height      Head Circumference      Peak Flow      Pain Score 10     Pain Loc      Pain Edu?      Excl. in GC?      Constitutional: Alert and oriented. Well appearing and in no acute distress. Eyes: Conjunctivae are normal. PERRL. EOMI. Head: Atraumatic.      Mouth/Throat: Mucous membranes are moist.  Patient has broken and decayed inferior 32.  Patient has mild right upper jaw edema. Cardiovascular: Normal rate, regular rhythm. Normal S1 and S2.  Good peripheral circulation. Respiratory: Normal respiratory  effort without tachypnea or retractions. Lungs CTAB. Good air entry to the bases with no decreased or absent breath sounds. Musculoskeletal: Full range of motion to all extremities. No gross deformities appreciated. Neurologic:  Normal speech and language. No gross focal neurologic deficits are appreciated.  Skin:  Skin is warm, dry and intact. No rash noted. Psychiatric: Mood and affect are normal. Speech and behavior are normal. Patient exhibits appropriate insight and judgement.   ____________________________________________   LABS (all labs ordered are listed, but only abnormal results are displayed)  Labs Reviewed - No data to  display ____________________________________________  EKG   ____________________________________________  RADIOLOGY   No results found.  ____________________________________________    PROCEDURES  Procedure(s) performed:    Procedures    Medications  ketorolac (TORADOL) 30 MG/ML injection 30 mg (has no administration in time range)  amoxicillin (AMOXIL) capsule 500 mg (has no administration in time range)     ____________________________________________   INITIAL IMPRESSION / ASSESSMENT AND PLAN / ED COURSE  Pertinent labs & imaging results that were available during my care of the patient were reviewed by me and considered in my medical decision making (see chart for details).  Review of the [place] CSRS was performed in accordance of the [facility] prior to dispensing any controlled drugs.      Assessment and plan Dental pain Patient presents to the emergency department with dental pain that started tonight from a broken inferior 32.  Patient was given an injection of Toradol and started on amoxicillin.  She was discharged with Toradol and amoxicillin.  She was advised to make an appointment with a local dentist as soon as possible.  All patient questions were answered.     ____________________________________________  FINAL CLINICAL IMPRESSION(S) / ED DIAGNOSES  Final diagnoses:  Pain, dental      NEW MEDICATIONS STARTED DURING THIS VISIT:  ED Discharge Orders        Ordered    amoxicillin (AMOXIL) 500 MG tablet  3 times daily     12/22/17 2247    ketorolac (TORADOL) 10 MG tablet  Every 6 hours PRN     12/22/17 2247          This chart was dictated using voice recognition software/Dragon. Despite best efforts to proofread, errors can occur which can change the meaning. Any change was purely unintentional.    Orvil FeilWoods, Juanice Warburton M, PA-C 12/22/17 2253    Myrna BlazerSchaevitz, David Matthew, MD 12/22/17 2312

## 2017-12-22 NOTE — ED Triage Notes (Signed)
Pt c/o lower right last tooth pain x1 day. Pt denies drainage. No swelling noted.

## 2017-12-22 NOTE — ED Notes (Signed)
pts visitor has come to the desk twice, asking when "her Aunt was going to be seen.  She is in an extreme amount of pain".  Upon arrival to the room to inform pt of the wait time, she was sound asleep on the bed and did not wake when I spoke to her visitor.
